# Patient Record
Sex: Male | Born: 1985 | Race: Black or African American | Hispanic: No | Marital: Single
Health system: Southern US, Community
[De-identification: ages and names within clinical notes are randomized; demographics above are authoritative.]

---

## 2013-03-12 ENCOUNTER — Ambulatory Visit: Payer: Self-pay | Admitting: Surgery

## 2013-03-12 LAB — CBC WITH DIFFERENTIAL/PLATELET
Basophil #: 0.1 10*3/uL (ref 0.0–0.1)
Basophil %: 1.9 %
Eosinophil #: 0.1 10*3/uL (ref 0.0–0.7)
Eosinophil %: 0.7 %
HCT: 49.3 % (ref 40.0–52.0)
HGB: 16.1 g/dL (ref 13.0–18.0)
LYMPHS ABS: 1.6 10*3/uL (ref 1.0–3.6)
Lymphocyte %: 23.3 %
MCH: 29.2 pg (ref 26.0–34.0)
MCHC: 32.7 g/dL (ref 32.0–36.0)
MCV: 89 fL (ref 80–100)
MONO ABS: 0.4 x10 3/mm (ref 0.2–1.0)
Monocyte %: 6.4 %
Neutrophil #: 4.6 10*3/uL (ref 1.4–6.5)
Neutrophil %: 67.7 %
Platelet: 194 10*3/uL (ref 150–440)
RBC: 5.52 10*6/uL (ref 4.40–5.90)
RDW: 14 % (ref 11.5–14.5)
WBC: 6.8 10*3/uL (ref 3.8–10.6)

## 2013-03-12 LAB — COMPREHENSIVE METABOLIC PANEL
ANION GAP: 9 (ref 7–16)
Albumin: 4 g/dL (ref 3.4–5.0)
Alkaline Phosphatase: 88 U/L
BILIRUBIN TOTAL: 0.6 mg/dL (ref 0.2–1.0)
BUN: 10 mg/dL (ref 7–18)
Calcium, Total: 9.2 mg/dL (ref 8.5–10.1)
Chloride: 104 mmol/L (ref 98–107)
Co2: 24 mmol/L (ref 21–32)
Creatinine: 0.93 mg/dL (ref 0.60–1.30)
EGFR (African American): >60
Glucose: 104 mg/dL — ABNORMAL HIGH (ref 65–99)
Osmolality: 273 (ref 275–301)
Potassium: 3.9 mmol/L (ref 3.5–5.1)
SGOT(AST): 26 U/L (ref 15–37)
SGPT (ALT): 29 U/L (ref 12–78)
SODIUM: 137 mmol/L (ref 136–145)
Total Protein: 8.6 g/dL — ABNORMAL HIGH (ref 6.4–8.2)

## 2013-03-12 LAB — LIPASE, BLOOD: Lipase: 83 U/L (ref 73–393)

## 2013-03-12 LAB — URINALYSIS, COMPLETE
BILIRUBIN, UR: NEGATIVE
Bacteria: NONE SEEN
GLUCOSE, UR: NEGATIVE mg/dL (ref 0–75)
Leukocyte Esterase: NEGATIVE
NITRITE: NEGATIVE
PROTEIN: NEGATIVE
Ph: 6 (ref 4.5–8.0)
Specific Gravity: 1.023 (ref 1.003–1.030)
Squamous Epithelial: NONE SEEN
WBC UR: 3 /HPF (ref 0–5)

## 2013-03-13 LAB — BASIC METABOLIC PANEL
Anion Gap: 4 — ABNORMAL LOW (ref 7–16)
BUN: 8 mg/dL (ref 7–18)
Calcium, Total: 8.7 mg/dL (ref 8.5–10.1)
Chloride: 104 mmol/L (ref 98–107)
Co2: 27 mmol/L (ref 21–32)
Creatinine: 1.13 mg/dL (ref 0.60–1.30)
EGFR (African American): >60
Glucose: 135 mg/dL — ABNORMAL HIGH (ref 65–99)
Osmolality: 270 (ref 275–301)
Potassium: 3.6 mmol/L (ref 3.5–5.1)
Sodium: 135 mmol/L — ABNORMAL LOW (ref 136–145)

## 2013-03-13 LAB — CBC WITH DIFFERENTIAL/PLATELET
Basophil #: 0 10*3/uL (ref 0.0–0.1)
Basophil %: 0.1 %
Eosinophil #: 0 10*3/uL (ref 0.0–0.7)
Eosinophil %: 0 %
HCT: 45.6 % (ref 40.0–52.0)
HGB: 15 g/dL (ref 13.0–18.0)
Lymphocyte #: 0.8 10*3/uL — ABNORMAL LOW (ref 1.0–3.6)
Lymphocyte %: 7.6 %
MCH: 29.3 pg (ref 26.0–34.0)
MCHC: 32.9 g/dL (ref 32.0–36.0)
MCV: 89 fL (ref 80–100)
MONO ABS: 0.6 x10 3/mm (ref 0.2–1.0)
Monocyte %: 5 %
NEUTROS ABS: 9.7 10*3/uL — AB (ref 1.4–6.5)
NEUTROS PCT: 87.3 %
PLATELETS: 178 10*3/uL (ref 150–440)
RBC: 5.13 10*6/uL (ref 4.40–5.90)
RDW: 14.3 % (ref 11.5–14.5)
WBC: 11.1 10*3/uL — ABNORMAL HIGH (ref 3.8–10.6)

## 2013-03-13 LAB — HEPATIC FUNCTION PANEL A (ARMC)
AST: 60 U/L — AB (ref 15–37)
Albumin: 3.2 g/dL — ABNORMAL LOW (ref 3.4–5.0)
Alkaline Phosphatase: 77 U/L
BILIRUBIN DIRECT: 0.1 mg/dL (ref 0.00–0.20)
Bilirubin,Total: 0.5 mg/dL (ref 0.2–1.0)
SGPT (ALT): 46 U/L (ref 12–78)
Total Protein: 7.5 g/dL (ref 6.4–8.2)

## 2013-03-15 LAB — PATHOLOGY REPORT

## 2014-06-02 NOTE — H&P (Signed)
PATIENT NAME:  Jon PlumberJEFFRIES, Jaquawn H MR#:  [id number] DATE OF BIRTH:  1986-01-04  DATE OF ADMISSION:  03/12/2013  PRIMARY CARE DOCTOR: None.   ADMITTING PHYSICIAN: Dr. Michela PitcherEly.   CHIEF COMPLAINT: Abdominal pain, nausea and vomiting.   BRIEF HISTORY: Lonna DuvalWilliam Tripathi is a 29 year old gentleman seen in the Emergency Room with a 12 to 18 hour history of severe midepigastric, right upper quadrant pain radiating through to his back associated with profound nausea and profuse vomiting. He has had multiple similar episodes all of which have resolved spontaneously. The current episode did not resolve. He presented to the emergency for further evaluation. He complains of bloating, indigestion regularly, fatty food intolerance, but no diarrhea or fever. He has never been worked up before  with these symptoms. Denies any history of hepatitis, yellow jaundice, pancreatitis, peptic ulcer disease, previous diagnosis of gallbladder disease or diverticulitis. There is no previous abdominal surgery. He has a history of hypertension, but does not take any medications currently. He does not have a primary care physician. He denies any cardiac history, diabetes or hyperthyroid condition.   ALLERGIES: HE IS ALLERGIC TO PENICILLIN WHICH CAUSES HIVES.   SOCIAL HISTORY: He works in a plant doing moderately physical labor.   FAMILY HISTORY: He has no significant family history.   REVIEW OF SYSTEMS: Otherwise unremarkable.   PHYSICAL EXAMINATION: GENERAL: He is alert, pleasant gentleman in no significant distress.  VITAL SIGNS: His blood pressure is 155/70, heart rate is 60 and regular. He is afebrile. Pain scale is 7.  HEENT: No scleral icterus. No pupillary abnormalities. No facial deformities.  NECK: Supple, nontender with no organomegaly. No adenopathy and normal midline trachea.   CHEST: Clear with no adventitious sounds and has normal pulmonary excursion.  CARDIAC: No murmurs or gallops to my ear. He seems to be in  normal sinus rhythm.  ABDOMEN: His abdomen is generally soft, but he has some mild right upper quadrant midepigastric tenderness with mild radiation around the right flank. He has no rebound, but some guarding. He has active bowel sounds. He does not have any hernias noted.  EXTREMITIES: Lower extremity exam reveals full range of motion, no deformities. Good distal pulses.  PSYCHIATRIC: Normal orientation, normal affect.   Ultrasound was performed which demonstrates a possible impacted cystic duct stone, multiple stones with no evidence of any gallbladder wall thickening or pericholecystic fluid. He does have a positive sonographic Murphy's sign. Liver function studies are unremarkable. He does not have an elevated white blood cell count.   Because he continues to have pain and has had long-standing symptoms, we will recommend surgical intervention. We discussed the options with him in detail and he has elected to proceed with surgery today. We will schedule surgical intervention later this afternoon. This plan has been outlined with the patient and he is in agreement.   ____________________________ Carmie Endalph L. Ely III, MD rle:sg D: 03/12/2013 10:39:23 ET T: 03/12/2013 13:27:57 ET JOB#: [id number]  cc: Quentin Orealph L. Ely III, MD, <Dictator> Quentin OreALPH L ELY MD ELECTRONICALLY SIGNED 03/20/2013 22:54

## 2014-06-02 NOTE — Op Note (Signed)
PATIENT NAME:  Jon Rich, Jon Rich MR#:  [id number] DATE OF BIRTH:  [birth year]  DATE OF PROCEDURE:  03/12/2013  PREOPERATIVE DIAGNOSIS: Acute cholecystitis.   POSTOPERATIVE DIAGNOSIS: Acute cholecystitis.   PROCEDURE: Laparoscopic cholecystectomy.   SURGEON: Quentin Orealph L. Ely III, MD  ANESTHESIA: General.   OPERATIVE PROCEDURE: With the patient in the supine position, after the induction of appropriate general anesthesia, the patient's abdomen was prepped with ChloraPrep and draped with sterile towels. A supraumbilical incision was made in the standard fashion, carried down bluntly through the subcutaneous tissue. Veress needle was introduced in the peritoneal cavity. CO2 was insufflated to appropriate pressure measurements. When approximately 2.5 liters of CO2 were instilled, the Veress needle was withdrawn and an 11 mm [employer] medical port was inserted into the peritoneal cavity. Position was confirmed. CO2 was reinsufflated. The patient was placed head up, feet down position and rotated slightly to the left side. A subxiphoid transverse incision was made and an 11 mm port was inserted under direct vision. Two lateral ports, 5 mm in size, were inserted under direct vision. The gallbladder was tense and distended, sticking up well above the liver edge. It was discolored and edematous. It was drained of approximately 40 mL of clear bile. The gallbladder was retracted superiorly and laterally, exposing the hepatoduodenal ligament. The cystic artery and cystic duct were identified. The cystic duct was clipped on the gallbladder side and opened. An on-table cholangiogram was attempted but the catheter could not be inserted through the very small duct. The catheter was withdrawn. The cystic duct was doubly clipped on the common duct side and divided. The cystic artery was doubly clipped and divided. The gallbladder was then dissected free from its bed and delivered using hook and cautery apparatus. Dissection was  quite difficult because the patient's gallbladder was partially intrahepatic. We entered the liver in several areas, trying hard to avoid the gallbladder since there were multiple small stones noted in the gallbladder. No injury was identified in the gallbladder. Once the gallbladder was free, it was captured in an Endo Catch apparatus and removed through the subxiphoid incision. However, in removing the gallbladder in the bag, I had to enlarge the incision and  I elected to enlarge laterally.  I believe I entered 1 of the epigastric arteries on that maneuver. The abdominal wall then blood significantly. The gallbladder was removed uneventfully after that and the bleeding was initially controlled with finger palpation.   A 10 mm flat Jackson-Pratt drain was inserted into the abdominal cavity and then the upper abdominal incision closed with figure-of-eight sutures transfascial sutures of 0 Vicryl under direct vision using the suture passer. Figure-of-eight sutures were utilized. However, a significant amount of blood was lost while placing and tying the sutures, I would estimate approximately 400 mL blood loss. Bleeding appeared to be well controlled. The abdomen was then irrigated with 3 liters of warm saline solution. There was still some bleeding off the lateral edge of the liver where the gallbladder was intrahepatic. This area was cauterized under direct vision. To provide better exposure with the epigastric port missing, the abdomen was reaccessed with another 5 mm port in the left upper quadrant. The drainage catheter was brought out through a lateral stab wound and placed in the bed of the liver. Again, the abdomen  continued to be irrigated until the irrigant was mostly clear. A patch of EndoAvitene was placed along the lateral liver edge near the site of the gallbladder removed. All ports were withdrawn without  difficulty. Skin incisions were closed with 5-0 nylon and 3-0 nylon. The drain was secured  with 3-0 nylon. Sterile dressings were applied after utilizing 0.25% Marcaine for postoperative pain control. The patient was returned to the recovery room in satisfactory condition.     ____________________________ Quentin Ore III, MD rle:cs D: 03/12/2013 17:58:45 ET T: 03/12/2013 20:55:26 ET JOB#: [id number]  cc: Quentin Ore III, MD, <Dictator> Quentin Ore MD ELECTRONICALLY SIGNED 03/20/2013 22:55

## 2017-06-11 ENCOUNTER — Other Ambulatory Visit: Payer: Self-pay | Admitting: Orthopedic Surgery

## 2017-06-11 DIAGNOSIS — M25561 Pain in right knee: Secondary | ICD-10-CM

## 2017-06-24 ENCOUNTER — Ambulatory Visit
Admission: RE | Admit: 2017-06-24 | Discharge: 2017-06-24 | Disposition: A | Discharge status: Home / Self Care | Payer: 59 | Source: Ambulatory Visit | Attending: Orthopedic Surgery | Admitting: Orthopedic Surgery

## 2017-06-24 DIAGNOSIS — M25561 Pain in right knee: Secondary | ICD-10-CM

## 2019-11-12 IMAGING — MR MR KNEE*R* W/O CM
5 series · 37 of 40 positions shown · non-contrast
Comparison: None.

CLINICAL DATA: Lateral right knee pain and difficulty walking since
February 2017. No known injury.

EXAM:
MRI OF THE RIGHT KNEE WITHOUT CONTRAST
TECHNIQUE: Multiplanar, multisequence MR imaging of the knee was performed. No
intravenous contrast was administered.

[Series 5: PD fat-sat · coronal · right · 3.0mm · 0.52mm/px · 9 of 36 slices shown (1 of 2)]
[im 1/36]
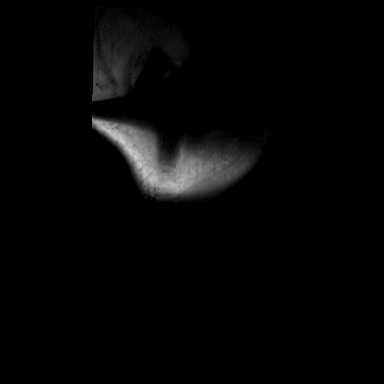
[im 5/36]
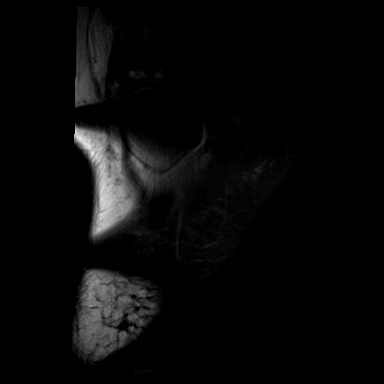
[im 9/36]
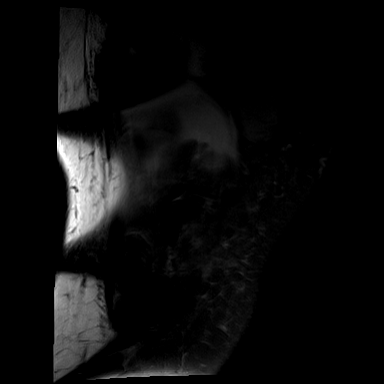
[im 14/36]
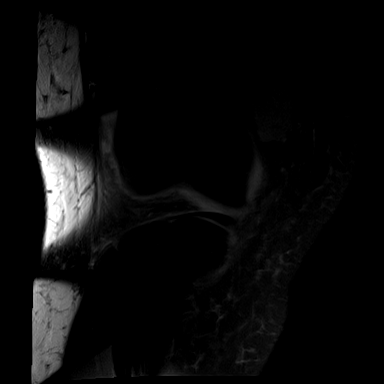
[im 18/36]
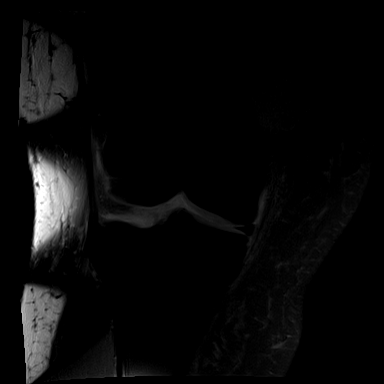
[im 22/36]
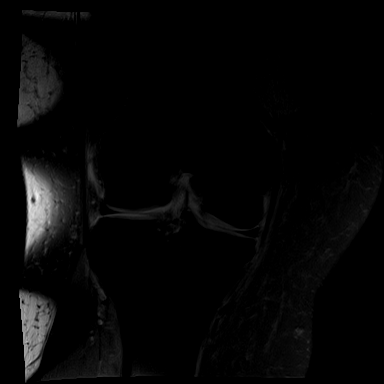
[im 27/36]
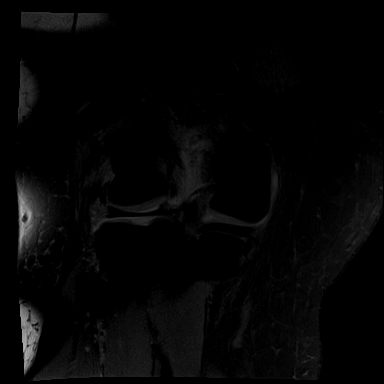
[im 31/36]
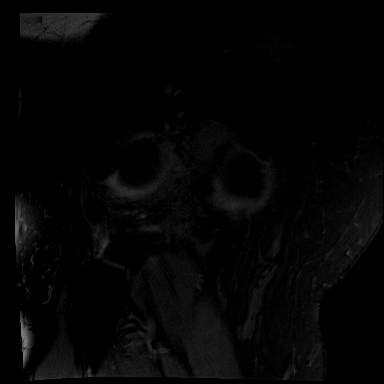
[im 36/36]
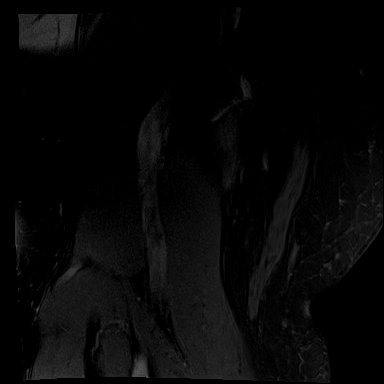

[Series 6: T1 · coronal · right · 3.0mm · 0.52mm/px · 6 of 36 slices shown]
[im 1/36]
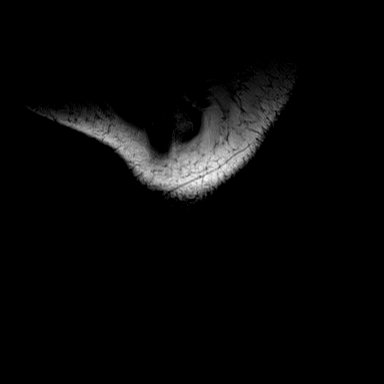
[im 5/36]
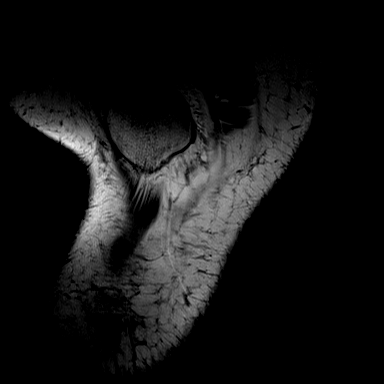
[im 9/36]
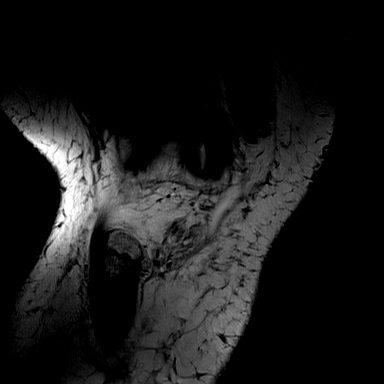
[im 14/36]
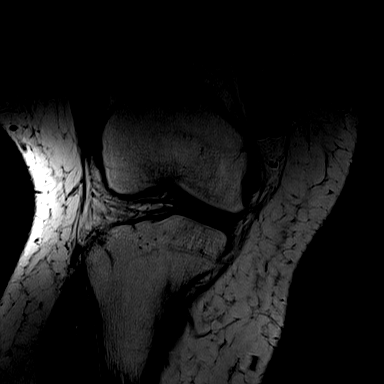
[im 22/36]
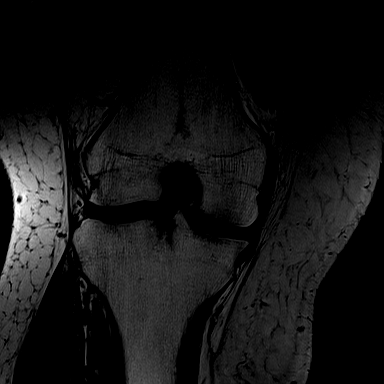
[im 27/36]
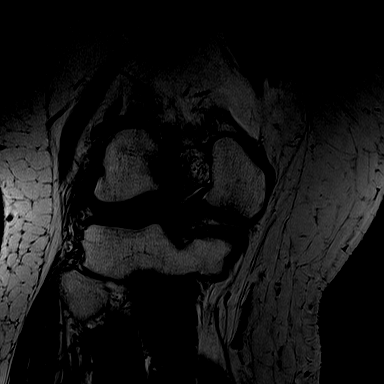

[Series 7: T2 fat-sat · coronal · right · 3.0mm · 0.52mm/px · 9 of 36 slices shown]
[im 1/36]
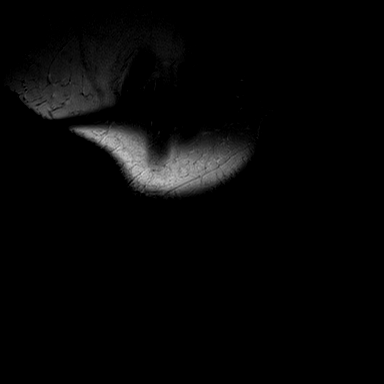
[im 5/36]
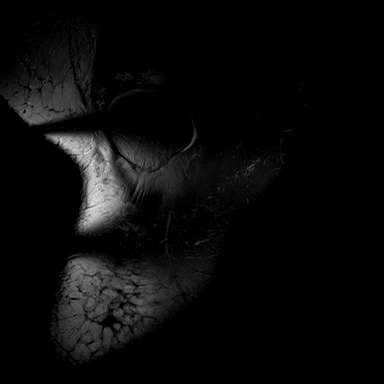
[im 9/36]
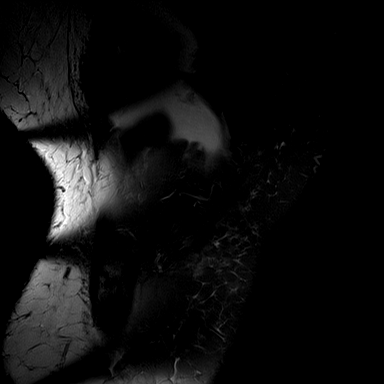
[im 14/36]
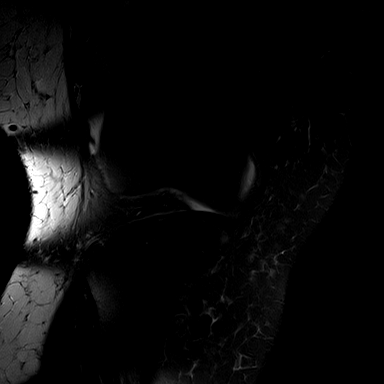
[im 18/36]
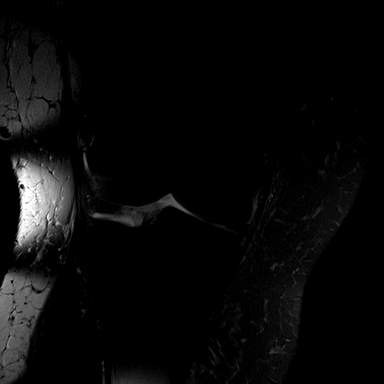
[im 22/36]
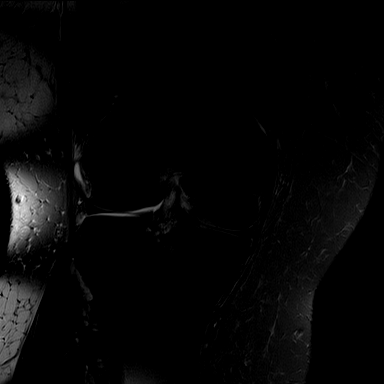
[im 27/36]
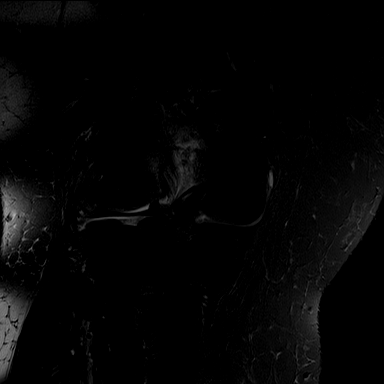
[im 31/36]
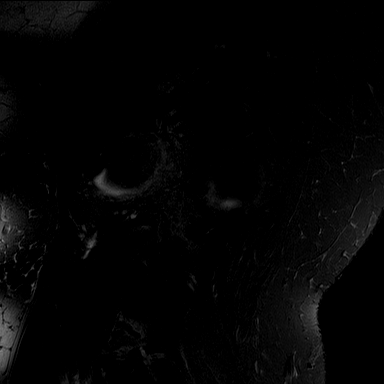
[im 36/36]
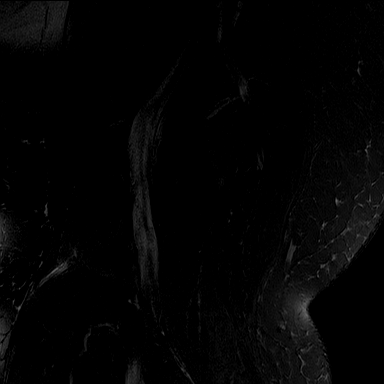

[Series 8: PD fat-sat · oblique · right · 3.2mm · 0.62mm/px · 8 of 30 slices shown (2 of 2)]
[im 1/30]
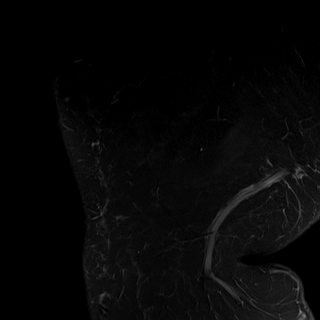
[im 5/30]
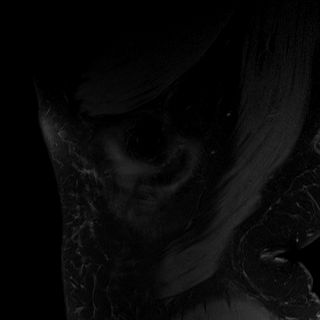
[im 9/30]
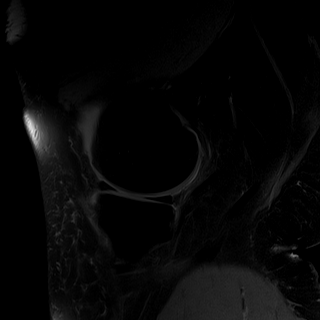
[im 13/30]
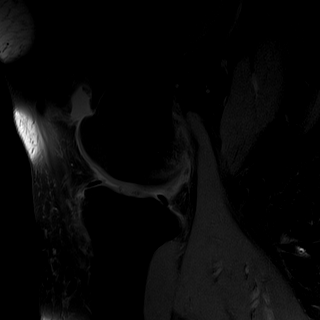
[im 17/30]
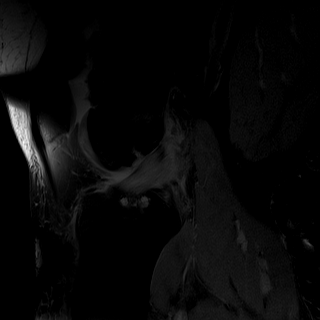
[im 21/30]
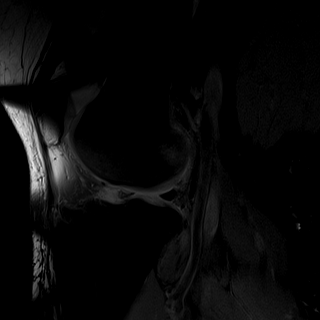
[im 25/30]
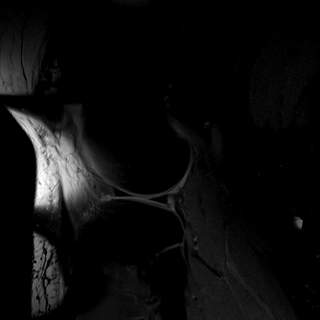
[im 30/30]
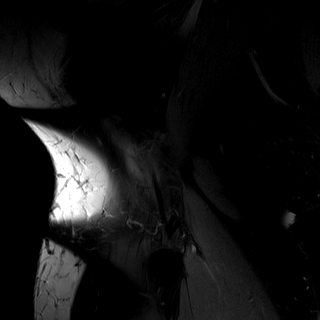

[Series 9: PD · oblique · right · 1.5mm · 0.44mm/px · 5 of 21 slices shown]
[im 1/21]
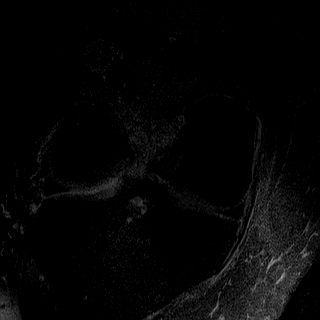
[im 6/21]
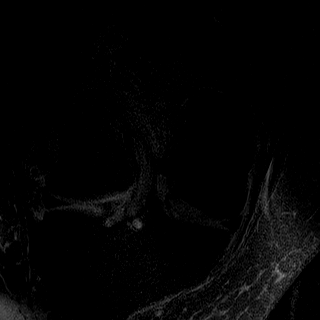
[im 11/21]
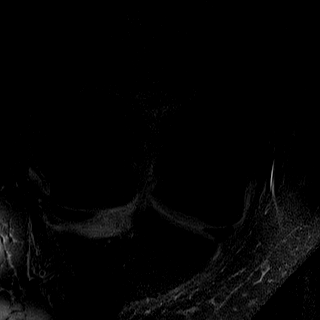
[im 16/21]
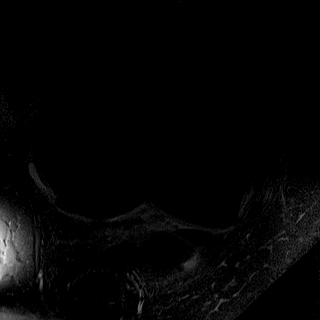
[im 21/21]
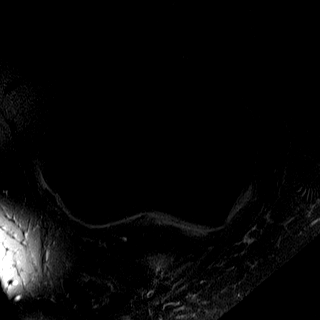

[37 of 40 positions shown; findings below may reference images not displayed]

FINDINGS: MENISCI

Medial meniscus:  Intact.

Lateral meniscus:  Intact.

LIGAMENTS

Cruciates: Intact. Mucoid degeneration of both ligaments is worse in
the ACL where it is severe.

Collaterals:  Intact.

CARTILAGE

Patellofemoral:  Preserved.

Medial:  Preserved.

Lateral:  Preserved.

Joint:  Small effusion.

Popliteal Fossa:  No Baker's cyst.

Extensor Mechanism: There is some increased fragmentation of the T2
signal in the tibial tuberosity and mild intrasubstance patellar
tendon at its attachment to the tibia. No tear. The extensor
mechanism is otherwise normal.

Bones: No fracture or worrisome lesion. There is some edema in the
femur and tibia at the ACL attachment sites related to mucoid
degeneration.

Other: None.
IMPRESSION: Osgood-Schlatter disease with edema in the distal patellar tendon
consistent with tendinosis.

Severe mucoid degeneration of the ACL with associated reactive
marrow signal change in the femur and tibia.

## 2020-03-21 ENCOUNTER — Ambulatory Visit: Payer: Self-pay | Admitting: Nurse Practitioner

## 2021-02-21 DIAGNOSIS — H6123 Impacted cerumen, bilateral: Secondary | ICD-10-CM | POA: Diagnosis not present

## 2021-02-21 DIAGNOSIS — H93293 Other abnormal auditory perceptions, bilateral: Secondary | ICD-10-CM | POA: Diagnosis not present

## 2021-03-25 ENCOUNTER — Other Ambulatory Visit (HOSPITAL_BASED_OUTPATIENT_CLINIC_OR_DEPARTMENT_OTHER): Payer: Self-pay

## 2021-03-25 DIAGNOSIS — R0681 Apnea, not elsewhere classified: Secondary | ICD-10-CM

## 2021-03-25 DIAGNOSIS — G4710 Hypersomnia, unspecified: Secondary | ICD-10-CM

## 2021-03-25 DIAGNOSIS — G479 Sleep disorder, unspecified: Secondary | ICD-10-CM

## 2021-03-25 DIAGNOSIS — R0683 Snoring: Secondary | ICD-10-CM

## 2021-08-22 DIAGNOSIS — H6123 Impacted cerumen, bilateral: Secondary | ICD-10-CM | POA: Diagnosis not present

## 2021-08-22 DIAGNOSIS — H938X3 Other specified disorders of ear, bilateral: Secondary | ICD-10-CM | POA: Diagnosis not present

## 2021-08-29 DIAGNOSIS — E039 Hypothyroidism, unspecified: Secondary | ICD-10-CM | POA: Diagnosis not present

## 2021-08-29 DIAGNOSIS — R7303 Prediabetes: Secondary | ICD-10-CM | POA: Diagnosis not present

## 2021-08-29 DIAGNOSIS — I10 Essential (primary) hypertension: Secondary | ICD-10-CM | POA: Diagnosis not present

## 2022-09-03 DIAGNOSIS — H93293 Other abnormal auditory perceptions, bilateral: Secondary | ICD-10-CM | POA: Diagnosis not present

## 2022-09-03 DIAGNOSIS — H6123 Impacted cerumen, bilateral: Secondary | ICD-10-CM | POA: Diagnosis not present
